# Patient Record
Sex: Male | Born: 1995
Health system: Southern US, Community
[De-identification: ages and names within clinical notes are randomized; demographics above are authoritative.]

## PROBLEM LIST (undated history)

## (undated) DIAGNOSIS — J329 Chronic sinusitis, unspecified: Secondary | ICD-10-CM

## (undated) DIAGNOSIS — N200 Calculus of kidney: Secondary | ICD-10-CM

## (undated) DIAGNOSIS — J40 Bronchitis, not specified as acute or chronic: Secondary | ICD-10-CM

## (undated) DIAGNOSIS — S83519A Sprain of anterior cruciate ligament of unspecified knee, initial encounter: Secondary | ICD-10-CM

---
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
## 2015-01-15 ENCOUNTER — Ambulatory Visit (INDEPENDENT_AMBULATORY_CARE_PROVIDER_SITE_OTHER): Payer: 59 | Admitting: Family Medicine

## 2015-01-15 VITALS — BP 146/90 | HR 77 | Wt 261.4 lb

## 2015-01-15 DIAGNOSIS — B354 Tinea corporis: Secondary | ICD-10-CM | POA: Diagnosis not present

## 2015-01-15 MED ORDER — TERBINAFINE HCL 250 MG PO TABS: 250.0000 mg | ORAL_TABLET | Freq: Every day | ORAL | Status: DC

## 2015-01-15 NOTE — Patient Instructions (Signed)
Use Lamisil AF 

## 2015-01-15 NOTE — Progress Notes (Signed)
   Subjective:    Patient ID: Joshua BartersMichael D Bohlin, male    DOB: 1996/02/20, 19 y.o.   MRN: [id number]  HPI He is here for evaluation of a skin fungal infection on the left neck area. It has been slowly growing in spite of him using a topical antifungal.   Review of Systems     Objective:   Physical Exam Linear slightly pigmented circular lesions noted on the left neck area.       Assessment & Plan:  Tinea corporis - Plan: terbinafine (LAMISIL) 250 MG tablet I will have him use one month supply of Lamisil as well as topical Lamisil. He will let me know whether this works or not.

## 2015-05-19 ENCOUNTER — Encounter: Payer: Self-pay | Admitting: Family Medicine

## 2015-10-03 IMAGING — US US ABDOMEN LIMITED
1 series · 14 of 25 positions shown · non-contrast
Comparison: None.

CLINICAL DATA: Right upper quadrant pain

EXAM:
US ABDOMEN LIMITED - RIGHT UPPER QUADRANT

[Series 1: us abdomen limited · 0.27mm/px · 14 of 49 slices shown]
[im 1/49]
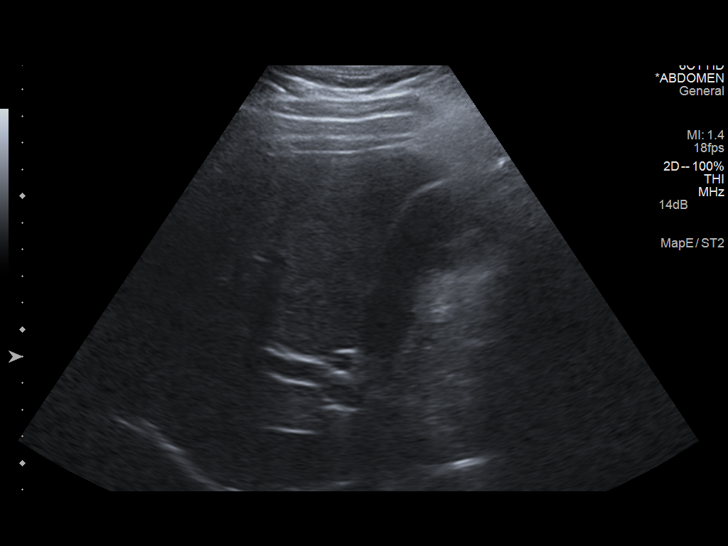
[im 5/49]
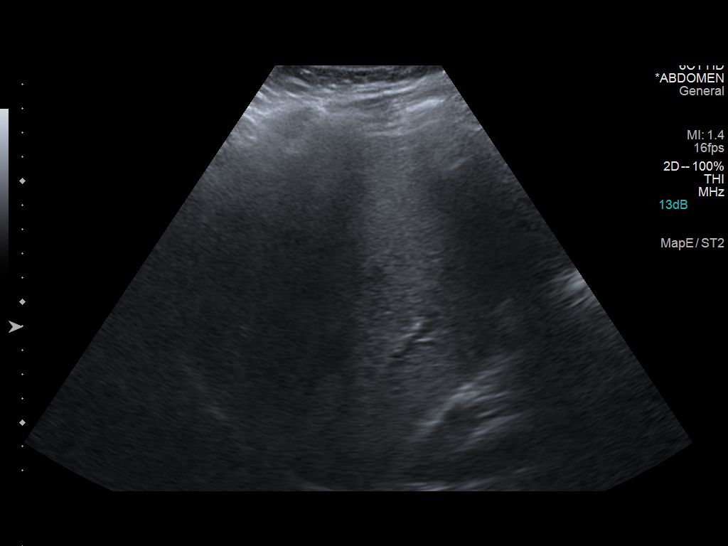
[im 9/49]
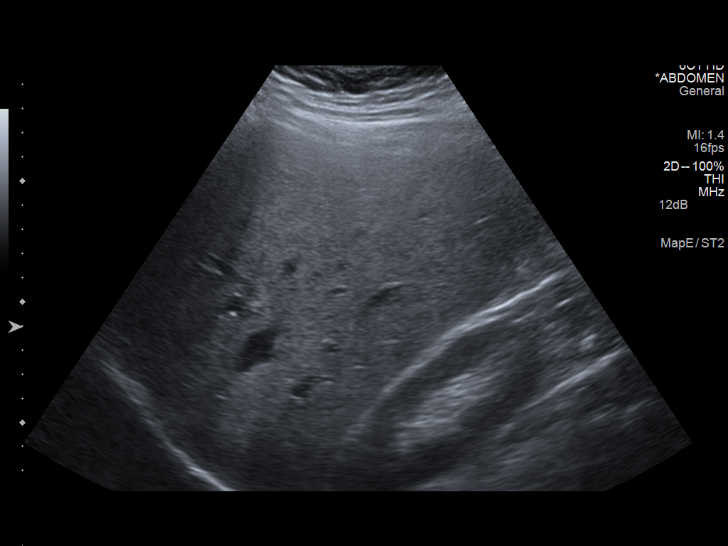
[im 13/49]
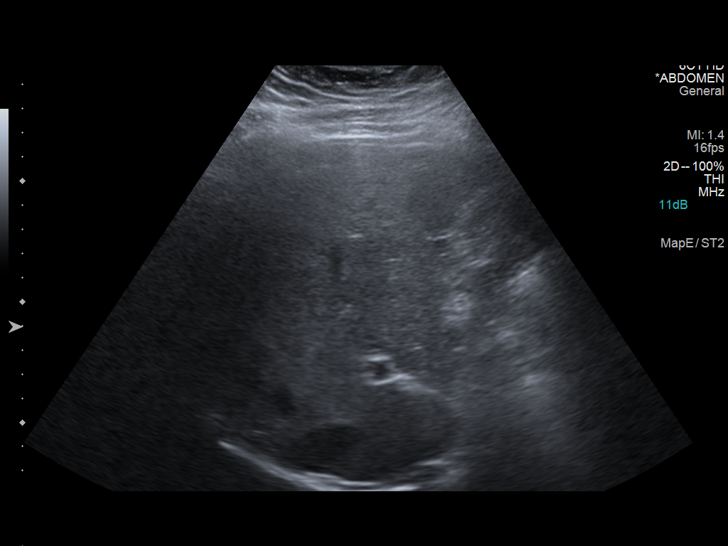
[im 17/49]
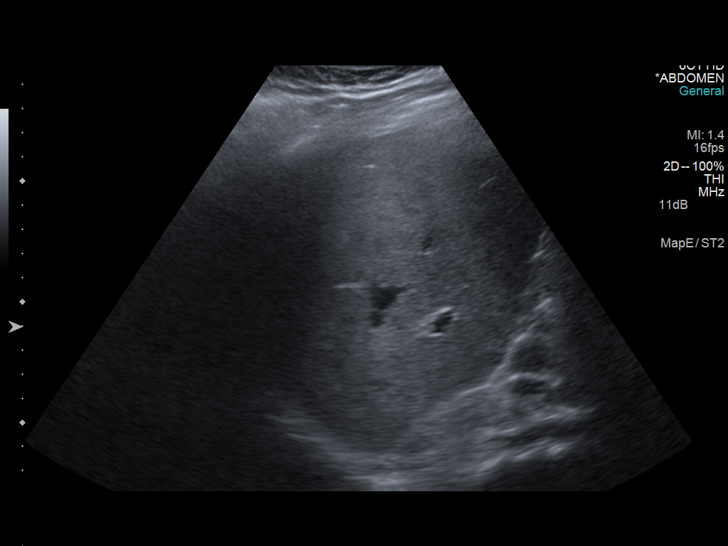
[im 19/49]
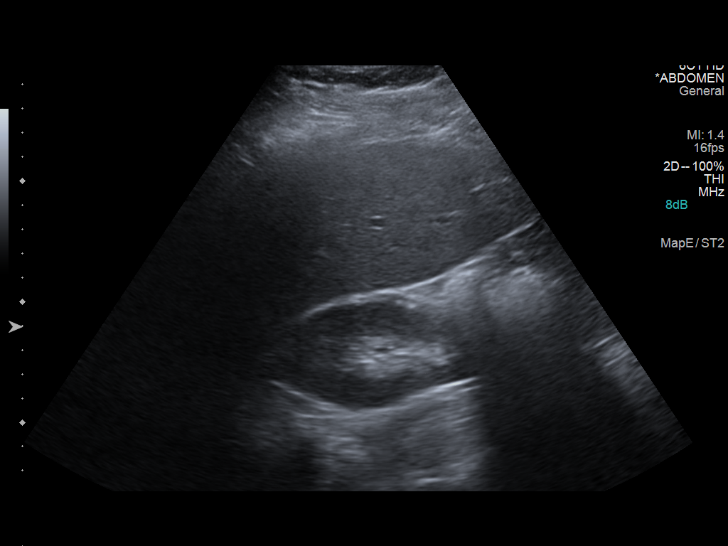
[im 23/49]
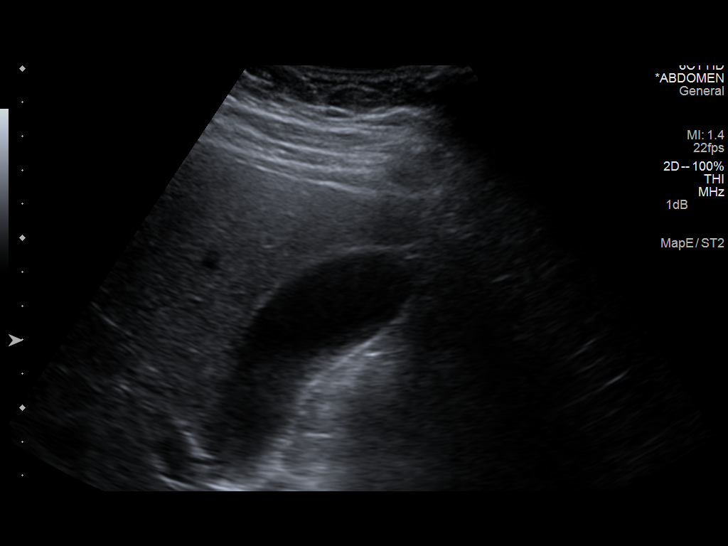
[im 27/49]
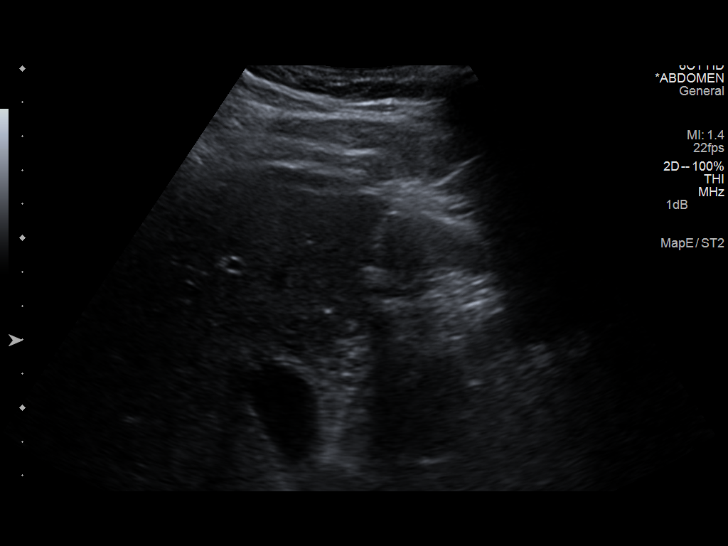
[im 31/49]
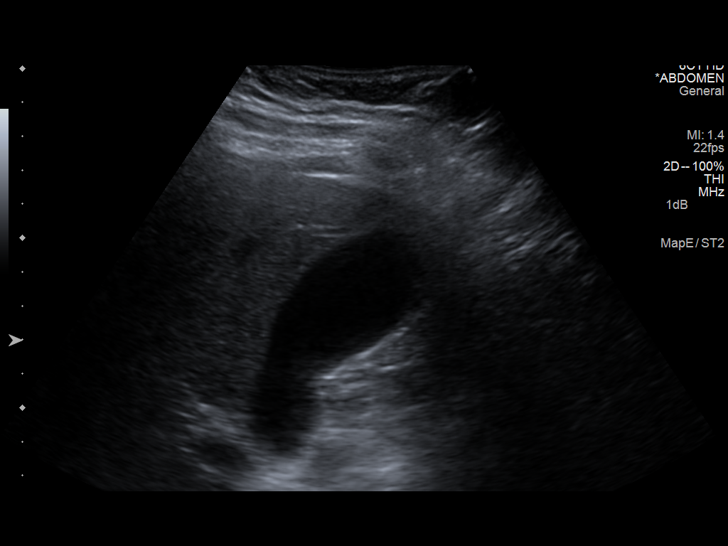
[im 33/49]
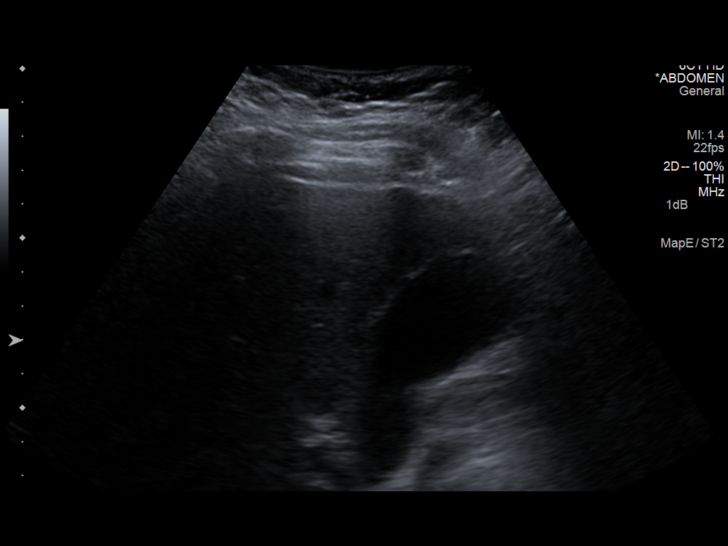
[im 37/49]
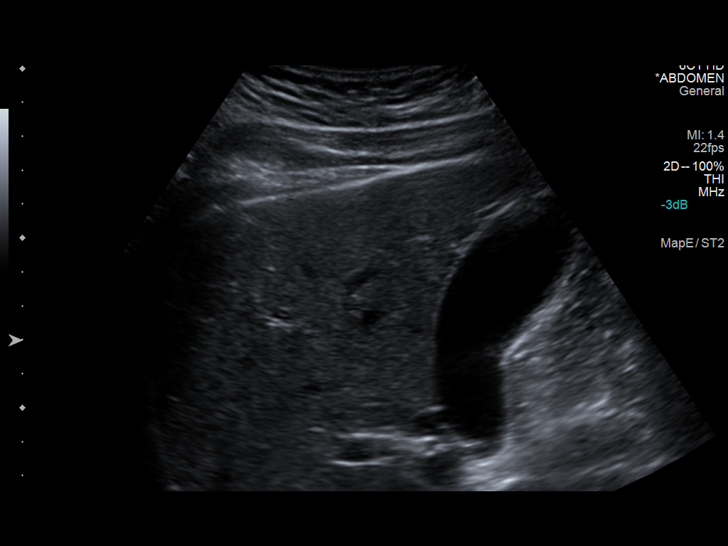
[im 41/49]
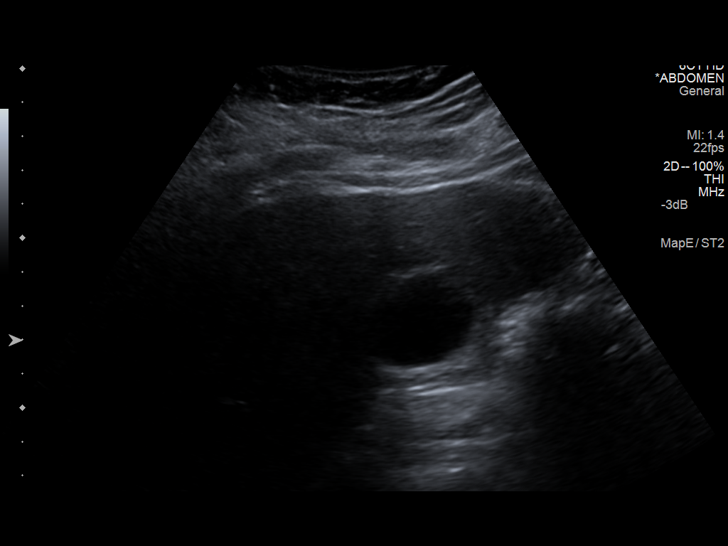
[im 45/49]
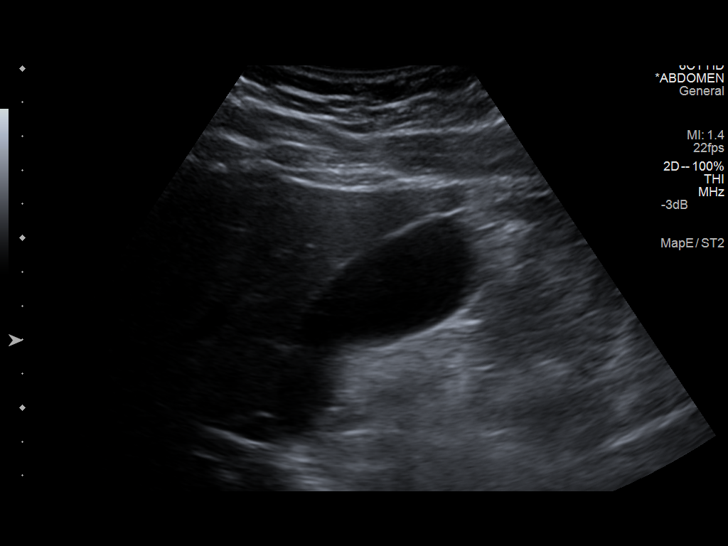
[im 49/49]
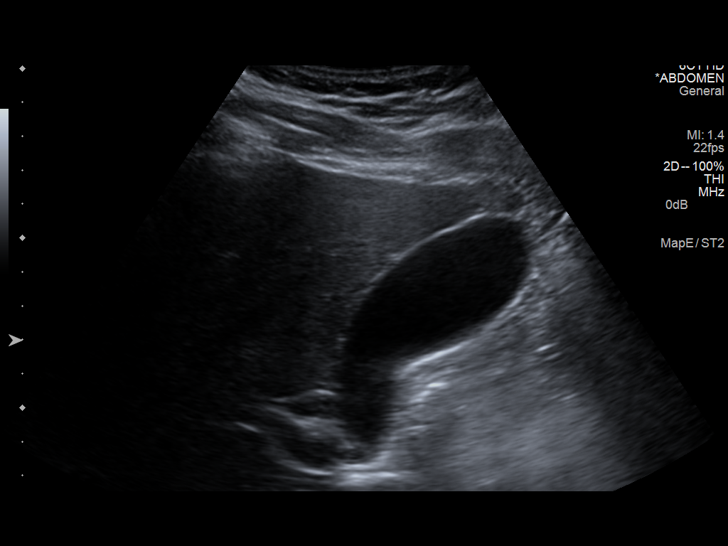

[14 of 25 positions shown; findings below may reference images not displayed]

FINDINGS: Gallbladder:

No gallstones or wall thickening visualized. No sonographic Murphy
sign noted. No pericholecystic fluid.

Common bile duct:

Diameter: 3 mm in diameter within normal limits.

Liver:

No focal lesion identified. Within normal limits in parenchymal
echogenicity.
IMPRESSION: Unremarkable right upper quadrant ultrasound.

## 2015-11-02 ENCOUNTER — Emergency Department (HOSPITAL_COMMUNITY): Payer: 59

## 2015-11-02 ENCOUNTER — Emergency Department (HOSPITAL_COMMUNITY)
Admission: EM | Admit: 2015-11-02 | Discharge: 2015-11-02 | Disposition: A | Discharge status: Home / Self Care | Payer: 59 | Attending: Emergency Medicine | Admitting: Emergency Medicine

## 2015-11-02 ENCOUNTER — Encounter (HOSPITAL_COMMUNITY): Payer: Self-pay

## 2015-11-02 DIAGNOSIS — Z87828 Personal history of other (healed) physical injury and trauma: Secondary | ICD-10-CM | POA: Diagnosis not present

## 2015-11-02 DIAGNOSIS — R1031 Right lower quadrant pain: Secondary | ICD-10-CM | POA: Insufficient documentation

## 2015-11-02 DIAGNOSIS — Z8709 Personal history of other diseases of the respiratory system: Secondary | ICD-10-CM | POA: Diagnosis not present

## 2015-11-02 DIAGNOSIS — Z87442 Personal history of urinary calculi: Secondary | ICD-10-CM | POA: Diagnosis not present

## 2015-11-02 DIAGNOSIS — R109 Unspecified abdominal pain: Secondary | ICD-10-CM | POA: Diagnosis not present

## 2015-11-02 HISTORY — DX: Calculus of kidney: N20.0

## 2015-11-02 LAB — URINALYSIS, ROUTINE W REFLEX MICROSCOPIC
BILIRUBIN URINE: NEGATIVE
Glucose, UA: NEGATIVE mg/dL
HGB URINE DIPSTICK: NEGATIVE
KETONES UR: NEGATIVE mg/dL
Leukocytes, UA: NEGATIVE
Nitrite: NEGATIVE
PROTEIN: NEGATIVE mg/dL
Specific Gravity, Urine: 1.037 — ABNORMAL HIGH (ref 1.005–1.030)
pH: 6 (ref 5.0–8.0)

## 2015-11-02 LAB — CBC WITH DIFFERENTIAL/PLATELET
Basophils Absolute: 0 10*3/uL (ref 0.0–0.1)
Basophils Relative: 0 %
EOS PCT: 1 %
Eosinophils Absolute: 0 10*3/uL (ref 0.0–0.7)
HEMATOCRIT: 45.4 % (ref 39.0–52.0)
Hemoglobin: 15.7 g/dL (ref 13.0–17.0)
LYMPHS ABS: 1.7 10*3/uL (ref 0.7–4.0)
Lymphocytes Relative: 21 %
MCH: 30 pg (ref 26.0–34.0)
MCHC: 34.6 g/dL (ref 30.0–36.0)
MCV: 86.6 fL (ref 78.0–100.0)
Monocytes Absolute: 0.6 10*3/uL (ref 0.1–1.0)
Monocytes Relative: 7 %
Neutro Abs: 5.6 10*3/uL (ref 1.7–7.7)
Neutrophils Relative %: 71 %
PLATELETS: 211 10*3/uL (ref 150–400)
RBC: 5.24 MIL/uL (ref 4.22–5.81)
RDW: 12.7 % (ref 11.5–15.5)
WBC: 7.8 10*3/uL (ref 4.0–10.5)

## 2015-11-02 LAB — BASIC METABOLIC PANEL
ANION GAP: 9 (ref 5–15)
BUN: 14 mg/dL (ref 6–20)
CO2: 26 mmol/L (ref 22–32)
Calcium: 9.5 mg/dL (ref 8.9–10.3)
Chloride: 106 mmol/L (ref 101–111)
Creatinine, Ser: 0.98 mg/dL (ref 0.61–1.24)
GFR calc Af Amer: >60 mL/min (ref >60)
GLUCOSE: 107 mg/dL — AB (ref 65–99)
POTASSIUM: 4.1 mmol/L (ref 3.5–5.1)
Sodium: 141 mmol/L (ref 135–145)

## 2015-11-02 MED ORDER — IOHEXOL 300 MG/ML  SOLN
25.0000 mL | Freq: Once | INTRAMUSCULAR | Status: AC | PRN
  Administered 2015-11-02: 25 mL via ORAL

## 2015-11-02 MED ORDER — KETOROLAC TROMETHAMINE 30 MG/ML IJ SOLN
30.0000 mg | Freq: Once | INTRAMUSCULAR | Status: AC
  Administered 2015-11-02: 30 mg via INTRAVENOUS
  Filled 2015-11-02: qty 1

## 2015-11-02 MED ORDER — MORPHINE SULFATE (PF) 4 MG/ML IV SOLN
4.0000 mg | Freq: Once | INTRAVENOUS | Status: AC
  Administered 2015-11-02: 4 mg via INTRAVENOUS
  Filled 2015-11-02: qty 1

## 2015-11-02 MED ORDER — IOHEXOL 300 MG/ML  SOLN
100.0000 mL | Freq: Once | INTRAMUSCULAR | Status: AC | PRN
  Administered 2015-11-02: 100 mL via INTRAVENOUS

## 2015-11-02 MED ORDER — SODIUM CHLORIDE 0.9 % IV BOLUS (SEPSIS)
1000.0000 mL | Freq: Once | INTRAVENOUS | Status: AC
  Administered 2015-11-02: 1000 mL via INTRAVENOUS

## 2015-11-02 MED ORDER — ONDANSETRON HCL 4 MG/2ML IJ SOLN
4.0000 mg | Freq: Once | INTRAMUSCULAR | Status: AC
  Administered 2015-11-02: 4 mg via INTRAVENOUS
  Filled 2015-11-02: qty 2

## 2015-11-02 MED ORDER — OXYCODONE-ACETAMINOPHEN 5-325 MG PO TABS: 1.0000 | ORAL_TABLET | ORAL | Status: DC | PRN

## 2015-11-02 MED ORDER — ONDANSETRON HCL 4 MG PO TABS: 4.0000 mg | ORAL_TABLET | Freq: Four times a day (QID) | ORAL | Status: DC

## 2015-11-02 NOTE — ED Notes (Signed)
Patient c/o right flank pain. Patient has a history of kidney stones. Patient states he has not urinated since 1700 yesterday. Patient states he has not felt like urinating.

## 2015-11-02 NOTE — Discharge Instructions (Signed)
CT scan showed no acute findings. Prescription for pain and nausea medicine. Follow-up your primary care doctor

## 2015-11-02 NOTE — ED Provider Notes (Signed)
CSN: [id number]     Arrival date & time 11/02/15  4098 History   First MD Initiated Contact with Patient 11/02/15 1030     Chief Complaint  Patient presents with  . Flank Pain     (Consider location/radiation/quality/duration/timing/severity/associated sxs/prior Treatment) HPI.Marland KitchenMarland KitchenMarland KitchenRight lower quadrant pain with radiation to right flank since yesterday at 5 PM. Patient had a kidney stone in June 2016 and thinks the pain is similar. Patient also stated he has had an intussusception in the past and is concerned about this as well. No fever, sweats, chills, dysuria, hematuria, vomiting, diarrhea. Severity is moderate.  Past Medical History  Diagnosis Date  . Bronchitis 07/12/2011  . Sinus infection 07/12/2011  . Jaundice of newborn   . ACL tear     left  . Kidney stones    Past Surgical History  Procedure Laterality Date  . Knee arthroscopy w/ acl reconstruction  05/27/2011    right and left  . Anterior cruciate ligament repair  08/03/2011    D[facility]ves  . Anterior cruciate ligament repair  05/25/2012    Procedure: RECONSTRUCTION ANTERIOR CRUCIATE LIGAMENT (ACL) WITH HAMSTRING GRAFT;  Surgeon: Verlee Rossetti, MD;  Location: [facility] OR;  Service: Orthopedics;  Laterality: Left;  with meniscis repair   Family History  Problem Relation Age of Onset  . Hypertension Mother   . Anesthesia problems Mother     post-op nausea  . Mental illness Mother   . Depression Mother   . Diabetes Maternal Grandmother   . Cirrhosis Maternal Grandmother   . Breast cancer Maternal Grandmother   . Stroke Paternal Grandfather   . Diabetes Maternal Aunt   . Diabetes Maternal Grandfather   . Heart disease Maternal Grandfather   . COPD Maternal Grandfather   . Depression Maternal Grandfather   . Hearing loss Maternal Grandfather   . Depression Father   . Tongue cancer Maternal Grandmother   . Colon cancer Maternal Grandmother     great  . Colon polyps Maternal Grandmother   . Irritable bowel syndrome  Mother   . Kidney disease Maternal Grandfather   . Kidney disease Maternal Aunt    Social History  Substance Use Topics  . Smoking status: Passive Smoke Exposure - Never Smoker  . Smokeless tobacco: Never Used     Comment: smokers smoke outside  . Alcohol Use: No    Review of Systems  All other systems reviewed and are negative.     Allergies  Review of patient's allergies indicates no known allergies.  Home Medications   Prior to Admission medications   Medication Sig Start Date End Date Taking? Authorizing Provider  omeprazole (PRILOSEC) 40 MG capsule Take 1 capsule (40 mg total) by mouth daily. Patient not taking: Reported on 01/15/2015 02/04/14   Beverley Fiedler, MD  ondansetron (ZOFRAN) 4 MG tablet Take 1 tablet (4 mg total) by mouth every 6 (six) hours. 11/02/15   Donnetta Hutching, MD  oxyCODONE-acetaminophen (PERCOCET) 5-325 MG tablet Take 1-2 tablets by mouth every 4 (four) hours as needed. 11/02/15   Donnetta Hutching, MD  terbinafine (LAMISIL) 250 MG tablet Take 1 tablet (250 mg total) by mouth daily. Patient not taking: Reported on 11/02/2015 01/15/15   Ronnald Nian, MD   BP 137/65 mmHg  Pulse 68  Temp(Src) 98.1 F (36.7 C) (Oral)  Resp 14  Ht  (1.778 m)  Wt 270 lb (122.471 kg)  BMI 38.74 kg/m2  SpO2 100% Physical Exam  [id number]: He is oriented to  person, place, and time. He appears well-developed and well-nourished.  HENT:  Head: Normocephalic and atraumatic.  Eyes: Conjunctivae and EOM are normal. Pupils are equal, round, and reactive to light.  Neck: Normal range of motion. Neck supple.  Cardiovascular: Normal rate and regular rhythm.   Pulmonary/Chest: Effort normal and breath sounds normal.  Abdominal: Soft. Bowel sounds are normal.  Minimal right lower quadrant tenderness  Genitourinary:  Minimal right flank tenderness  Musculoskeletal: Normal range of motion.  Neurological: He is alert and oriented to person, place, and time.  Skin: Skin is warm and  dry.  Psychiatric: He has a normal mood and affect. His behavior is normal.  Nursing note and vitals reviewed.   ED Course  Procedures (including critical care time) Labs Review Labs Reviewed  URINALYSIS, ROUTINE W REFLEX MICROSCOPIC (NOT AT [facility]) - Abnormal; Notable for the following:    Specific Gravity, Urine 1.037 (*)    All other components within normal limits  BASIC METABOLIC PANEL - Abnormal; Notable for the following:    Glucose, Bld 107 (*)    All other components within normal limits  CBC WITH DIFFERENTIAL/PLATELET    Imaging Review Ct Abdomen Pelvis W Contrast  11/02/2015  CLINICAL DATA:  Right flank pain and dysuria EXAM: CT ABDOMEN AND PELVIS WITH CONTRAST TECHNIQUE: Multidetector CT imaging of the abdomen and pelvis was performed using the standard protocol following bolus administration of intravenous contrast. Oral contrast was also administered. CONTRAST:  OMNIPAQUE IOHEXOL 300 MG/ML  SOLN COMPARISON:  June 12, 2014 FINDINGS: Lower chest:  Lung bases are clear. Hepatobiliary: Liver is prominent, measuring 21.0 cm in length. No focal liver lesions are identified. Gallbladder wall is not appreciably thickened. There is no biliary duct dilatation. Pancreas: There is no pancreatic mass or inflammatory focus. Spleen: No splenic lesions are identified. Adrenals/Urinary Tract: Adrenals appear normal bilaterally. Kidneys bilaterally show no appreciable mass or hydronephrosis on either side. There is no renal or ureteral calculus on either side. The urinary bladder is largely decompressed. The urinary bladder wall thickness is within normal limits. Stomach/Bowel: There is no appreciable bowel wall or mesenteric thickening. No bowel obstruction. No free air or portal venous air. There are scattered diverticula in the descending colon without diverticulitis. Vascular/Lymphatic: There is no abdominal aortic aneurysm. No vascular lesions are evident. There is no demonstrable  adenopathy in the abdomen or pelvis. Reproductive: Prostate and seminal vesicles appear within normal limits. There is no pelvic mass or pelvic fluid collection. Other: The appendix appears normal in size and contour. No periappendiceal region inflammation. No abscess or ascites is appreciable in the abdomen or pelvis. Musculoskeletal: There are no blastic or lytic bone lesions. No intramuscular or abdominal wall lesion. IMPRESSION: A cause 1 for patient's symptoms has not been established with this study. No renal or ureteral calculus.  No hydronephrosis. No bowel wall thickening or bowel obstruction. No abscess. Appendix appears normal. There are scattered left-sided diverticula without diverticulitis. Prominent liver without focal lesion. Electronically Signed   By: Bretta Bang III M.D.   On: 11/02/2015 12:20   I have personally reviewed and evaluated these images and lab results as part of my medical decision-making.   EKG Interpretation None      MDM   Final diagnoses:  Right flank pain    Labs, urinalysis, CT abdomen pelvis all reassuring for negative pathology. No acute abdomen at discharge. Discharge medication Percocet and Zofran 8 mg.    Donnetta Hutching, MD 11/02/15 [phone]

## 2016-07-26 ENCOUNTER — Telehealth: Payer: Self-pay | Admitting: Internal Medicine

## 2016-07-26 ENCOUNTER — Ambulatory Visit (INDEPENDENT_AMBULATORY_CARE_PROVIDER_SITE_OTHER): Payer: 59 | Admitting: Family Medicine

## 2016-07-26 ENCOUNTER — Encounter: Payer: Self-pay | Admitting: Family Medicine

## 2016-07-26 ENCOUNTER — Ambulatory Visit
Admission: RE | Admit: 2016-07-26 | Discharge: 2016-07-26 | Disposition: A | Discharge status: Home / Self Care | Payer: 59 | Source: Ambulatory Visit | Attending: Family Medicine | Admitting: Family Medicine

## 2016-07-26 VITALS — BP 120/90 | HR 78 | Temp 97.8°F | Ht 70.0 in | Wt 270.0 lb

## 2016-07-26 DIAGNOSIS — R05 Cough: Secondary | ICD-10-CM | POA: Diagnosis not present

## 2016-07-26 DIAGNOSIS — R6889 Other general symptoms and signs: Secondary | ICD-10-CM

## 2016-07-26 DIAGNOSIS — M25512 Pain in left shoulder: Secondary | ICD-10-CM

## 2016-07-26 DIAGNOSIS — R059 Cough, unspecified: Secondary | ICD-10-CM

## 2016-07-26 LAB — POC INFLUENZA A&B (BINAX/QUICKVUE)
INFLUENZA B, POC: NEGATIVE
Influenza A, POC: NEGATIVE

## 2016-07-26 LAB — POCT RAPID STREP A (OFFICE): Rapid Strep A Screen: NEGATIVE

## 2016-07-26 MED ORDER — TRIAMCINOLONE ACETONIDE 40 MG/ML IJ SUSP
40.0000 mg | Freq: Once | INTRAMUSCULAR | Status: AC
  Administered 2016-07-26: 40 mg via INTRAMUSCULAR

## 2016-07-26 MED ORDER — LIDOCAINE HCL 2 % IJ SOLN
3.0000 mL | Freq: Once | INTRAMUSCULAR | Status: AC
  Administered 2016-07-26: 60 mg

## 2016-07-26 MED ORDER — AMOXICILLIN-POT CLAVULANATE 875-125 MG PO TABS: 1.0000 | ORAL_TABLET | Freq: Two times a day (BID) | ORAL | 0 refills | Status: DC

## 2016-07-26 MED FILL — AMOX-CLAV 875-125 MG TABLET: 875-125 | 7 days supply | Qty: 14 | Fill #0

## 2016-07-26 NOTE — Progress Notes (Signed)
Subjective:  Joshua Cook is a 20 y.o. male who presents for an acute onset of a 2 day history of fever (did not check at home), chills, body aches, rhinorrhea, nasal congestion, sore throat, post nasal drainage, dry cough and some mild nausea.  Does not smoke.  Childhood history of asthma. Reports being a preemie with lung problems as a baby.  Denies ear pain, chest pain, palpitations, shortness of breath, wheezing, vomiting or diarrhea.   Treatment to date: dayquil.  Denies sick contacts.  No other aggravating or relieving factors.   Also complains of a 1 week history of left shoulder pain, mainly anterior and radiates down his bicep. He first noted pain upon waking up 5 days ago. Denies injury or history of left shoulder pain.  Reports shoulder is tender, Pain is worse with laying on that shoulder and with movement and relieved with rest. No numbness or weakness but questionable tingling to left bicep.   ROS as in subjective.   Objective: Vitals:   07/26/16 0808  BP: 120/90  Pulse: 78  Temp: 97.8 F (36.6 C)    General appearance: Alert, WD/WN, no distress, mildly ill appearing                             Skin: warm, no rash                           Head: no sinus tenderness                            Eyes: conjunctiva normal, corneas clear, PERRLA                            Ears: pearly TMs, external ear canals normal                          Nose: septum midline, turbinates swollen, with erythema and thick purulent discharge             Mouth/throat: MMM, tongue normal, moderate pharyngeal erythema                           Neck: supple, no adenopathy, no thyromegaly, nontender                          Heart: RRR, normal S1, S2, no murmurs                         Lungs: CTA bilaterally, no wheezes, rales, or rhonchi   Extremities: left shoulder without erythema, edema, warmth, obvious deformity. Tenderness to anterior   and lateral AC joint. Pain with movement. Unable to do  a full exam due to pain and limited ROM.        Assessment: Flu-like symptoms - Plan: POC Influenza A&B(BINAX/QUICKVUE), POCT rapid strep A  Cough  Acute pain of left shoulder - Plan: DG Shoulder Left, lidocaine (XYLOCAINE) 2 % (with pres) injection 60 mg, triamcinolone acetonide (KENALOG-40) injection 40 mg    Plan:  Flu swab negative. Rapid strep negative. Discussed diagnosis and treatment of URI.  His upper respiratory symptoms appear to be related to viral etiology Suggested symptomatic OTC remedies. Nasal saline  spray for congestion.  Tylenol or Ibuprofen OTC for fever and malaise. Samples of Norel AD given. Advised that I will send an antibiotic prescription to his pharmacy and that if he is not improving in the next 2-3 days or if he worsens then he will start the antibiotic. Prescription sent now due to holiday weekend.  X-ray ordered for left shoulder. If his x-ray is normal then he will return to the office later today for an injection by Dr. Susann GivensLalonde. XR of shoulder negative. Patient returned and requested a steroid injection to his left shoulder.  Dr. Susann GivensLalonde cleaned the area with betadine. Lidocaine and triamcinolone was injected into the subacromial bursa. Patient tolerated procedure well.  Plan to have him follow up as needed.

## 2016-07-26 NOTE — Patient Instructions (Addendum)
Go for the XR to look at your left shoulder and then come back to the office.   For your upper respiratory symptoms, it appears to be related to a virus at this point. Treat your symptoms. You can take the Norel AD 1 tablet every 4-6 hours. Stay well hydrated. Try Delsym, Robitussin-DM, Mucinex DM if the Norel AD is not working.

## 2016-07-26 NOTE — Telephone Encounter (Signed)
If he hasn't made any improvement does get him in to see Terrilee FilesZach Cook or Fermin SchwabMike Rigby

## 2016-07-26 NOTE — Telephone Encounter (Signed)
Pt's shoulder is no better after shot.

## 2016-08-01 NOTE — Telephone Encounter (Signed)
Patient has been feeling better will wait to see how long shot will last

## 2016-09-23 ENCOUNTER — Ambulatory Visit: Payer: 59 | Admitting: Family Medicine

## 2016-09-24 DIAGNOSIS — J189 Pneumonia, unspecified organism: Secondary | ICD-10-CM | POA: Diagnosis not present

## 2016-09-24 DIAGNOSIS — J111 Influenza due to unidentified influenza virus with other respiratory manifestations: Secondary | ICD-10-CM | POA: Diagnosis not present

## 2016-09-29 ENCOUNTER — Ambulatory Visit (INDEPENDENT_AMBULATORY_CARE_PROVIDER_SITE_OTHER): Payer: 59 | Admitting: Family Medicine

## 2016-09-29 ENCOUNTER — Telehealth: Payer: Self-pay | Admitting: Internal Medicine

## 2016-09-29 ENCOUNTER — Encounter: Payer: Self-pay | Admitting: Family Medicine

## 2016-09-29 VITALS — BP 130/90 | HR 92 | Temp 98.2°F | Ht 70.5 in | Wt 274.8 lb

## 2016-09-29 DIAGNOSIS — R220 Localized swelling, mass and lump, head: Secondary | ICD-10-CM

## 2016-09-29 DIAGNOSIS — K111 Hypertrophy of salivary gland: Secondary | ICD-10-CM

## 2016-09-29 DIAGNOSIS — J392 Other diseases of pharynx: Secondary | ICD-10-CM

## 2016-09-29 LAB — POCT MONO (EPSTEIN BARR VIRUS): Mono, POC: NEGATIVE

## 2016-09-29 NOTE — Telephone Encounter (Signed)
Let's have him give this more time. As long as he is not getting any worse. Have him call mid morning tomorrow and let me know how he is doing.

## 2016-09-29 NOTE — Telephone Encounter (Signed)
Pt was notified.  

## 2016-09-29 NOTE — Patient Instructions (Signed)
Use lemon drops. Keep me posted if you notice any worsening symptoms.   Mucocele of the Mouth Introduction A mucocele is a growth or bump (cyst) that is filled with mucus. A mucocele can form on various parts of the mouth, including the gums, the tongue, and the inside of the cheeks. A common spot is inside the lower lip. Mucoceles are not dangerous, and they are usually not painful. A mucocele can be uncomfortable if it is very large or if it is located under the tongue. Small mucoceles often clear up on their own. Treatment may not be needed. Mucoceles that are bigger or that keep coming back might need to be removed. What are the causes? Mucoceles form when the salivary ducts in the mouth are damaged and leak saliva. These ducts carry saliva from the salivary glands to the surface of the mouth. A mucocele can develop because of:  An injury to your mouth.  Sucking or biting on your lips or tongue.  A blocked salivary duct. This is sometimes caused by swelling.  Piercing of the tongue or lip for jewelry. In some cases, the cause may not be known. What are the signs or symptoms? Symptoms of this condition include a smooth, painless bumpinside your mouth. The bump may:  Show up all of a sudden.  Have thin walls and a bluish color.  Change in size. Most are smaller than  inch. Mucoceles under the tongue are called ranulas. These may be bigger and may push your tongue up and back. In some cases, this can make it hard to talk, swallow, or breathe. How is this diagnosed? This condition is usually diagnosed with a physical exam. Your health care provider will often be able to tell if you have a mucocele by looking at it and feeling it. You may also have tests, such as:  An ultrasound to check for problems with your salivary gland.  An X-ray to see if stones are blocking the exit of saliva. How is this treated? Treatment may depend on the size of the mucocele:  For a small mucocele,  treatment is usually not needed. It will drain on its own and go away.  For larger mucoceles and ranulas, surgery may be needed. This may be done if the mucocele does not go away or if it keeps coming back. The entire mucocele may be taken out. In some cases, the salivary gland may also be removed. Follow these instructions at home:  Take over-the-counter and prescription medicines only as told by your health care provider.  Do not try to drain a mucocele on your own. Do not poke a hole in it.  Do not use any tobacco products, such as cigarettes, chewing tobacco, and e-cigarettes. If you need help quitting, ask your health care provider.  Do not suck or bite on your lips or tongue.  If your mucocele was removed, avoid hard or spicy foods and foods that have high acidity while your mouth is healing.  Keep all follow-up visits as told by your health care provider. This is important. Contact a health care provider if:  You have a lump or cyst inside your mouth that does not go away.  You have a fever. Get help right away if:  You have a lump or cyst in your mouth that:  Becomes painful.  Gets large very fast.  You have a lump or cyst in your mouth that makes it hard to:  Swallow.  Talk.  Breathe. This information  is not intended to replace advice given to you by your health care provider. Make sure you discuss any questions you have with your health care provider. Document Released: 09/18/2015 Document Revised: 01/28/2016 Document Reviewed: 11/17/2014  2017 [employer]

## 2016-09-29 NOTE — Telephone Encounter (Signed)
Pt states he is not better at all and no swelling as gone down at all yet

## 2016-09-29 NOTE — Progress Notes (Signed)
   Subjective:    Patient ID: Joshua Cook, male    DOB: 1996-04-19, 21 y.o.   MRN: [id number]  HPI Chief Complaint  Patient presents with  . face swelling    face swelling. lymph nodes were swelling the other day and woke up this morning face swelling. he had flu and pneumonia last week   He is here with complaints of right sided facial swelling since yesterday. States swelling and pain was mild yesterday and when he woke up this morning the area under his right jaw was severely swollen and painful to touch.    States he was dagnosed with flu and pneumonia 6 [place]. States he is taking levofloxacin 750 mg once daily. Also taking Tessalon and theraflu States flu symptoms have improved and cough is not as bad. Coughing up green and white mucus occasionally.    Denies fever, chills, body aches, dizziness, headache, rhinorrhea, nasal congestion, ear pain, shortness of breath, chest pain, abdominal pain, GI or GU symptoms.  Denies difficulty swallowing or breathing.   Reviewed allergies, medications, past medical,  and social history.   Review of Systems Pertinent positives and negatives in the history of present illness.     Objective:   Physical Exam  Constitutional: He is oriented to person, place, and time. He appears well-developed and well-nourished. He does not have a sickly appearance. No distress.  HENT:  Right Ear: Tympanic membrane and ear canal normal.  Left Ear: Tympanic membrane and ear canal normal.  Nose: Mucosal edema and rhinorrhea present. Right sinus exhibits no maxillary sinus tenderness and no frontal sinus tenderness. Left sinus exhibits no maxillary sinus tenderness and no frontal sinus tenderness.  Mouth/Throat: Uvula is midline, oropharynx is clear and moist and mucous membranes are normal. No posterior oropharyngeal edema.  Right sided parotid swelling and tenderness. No erythema.   Cardiovascular: Normal rate, regular rhythm  and normal heart sounds.   Pulmonary/Chest: Effort normal. He has wheezes in the right middle field and the right lower field.  Neurological: He is alert and oriented to person, place, and time.  Skin: Skin is warm and dry.   BP 130/90   Pulse 92   Temp 98.2 F (36.8 C) (Oral) Comment: 98.2  Ht 5' 10.5" (1.791 m)   Wt 274 lb 12.8 oz (124.6 kg)   SpO2 97%   BMI 38.87 kg/m       Assessment & Plan:  Parotid gland enlargement  Right facial swelling - Plan: POCT Mono (Epstein Barr Virus)  Throat irritation  Dr. 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 He will continue taking antibiotics for recent diagnosis of pneumonia. Flu and pneumonia symptoms have improved.  Discussed that he appears to be improving from recent illnesses and the reason he came in today does not appear to be related.  He will try lemon drops and call me later today to give me an update.

## 2017-12-11 ENCOUNTER — Telehealth: Payer: Self-pay | Admitting: Internal Medicine

## 2017-12-11 NOTE — Telephone Encounter (Signed)
Tried to call pt but number listed is not correct. Pt is due for cpe

## 2020-05-04 DIAGNOSIS — D485 Neoplasm of uncertain behavior of skin: Secondary | ICD-10-CM | POA: Diagnosis not present

## 2020-05-04 DIAGNOSIS — D2239 Melanocytic nevi of other parts of face: Secondary | ICD-10-CM | POA: Diagnosis not present

## 2020-05-04 DIAGNOSIS — D2262 Melanocytic nevi of left upper limb, including shoulder: Secondary | ICD-10-CM | POA: Diagnosis not present

## 2020-07-23 DIAGNOSIS — M25461 Effusion, right knee: Secondary | ICD-10-CM | POA: Diagnosis not present

## 2020-07-23 DIAGNOSIS — M25561 Pain in right knee: Secondary | ICD-10-CM | POA: Diagnosis not present

## 2020-07-24 DIAGNOSIS — M7651 Patellar tendinitis, right knee: Secondary | ICD-10-CM | POA: Diagnosis not present

## 2020-08-11 DIAGNOSIS — R0981 Nasal congestion: Secondary | ICD-10-CM | POA: Diagnosis not present

## 2020-08-11 DIAGNOSIS — Z20828 Contact with and (suspected) exposure to other viral communicable diseases: Secondary | ICD-10-CM | POA: Diagnosis not present

## 2020-08-11 DIAGNOSIS — R051 Acute cough: Secondary | ICD-10-CM | POA: Diagnosis not present

## 2021-03-13 DIAGNOSIS — Z20828 Contact with and (suspected) exposure to other viral communicable diseases: Secondary | ICD-10-CM | POA: Diagnosis not present

## 2021-03-15 DIAGNOSIS — R051 Acute cough: Secondary | ICD-10-CM | POA: Diagnosis not present

## 2021-03-15 DIAGNOSIS — Z20828 Contact with and (suspected) exposure to other viral communicable diseases: Secondary | ICD-10-CM | POA: Diagnosis not present

## 2021-03-15 DIAGNOSIS — R519 Headache, unspecified: Secondary | ICD-10-CM | POA: Diagnosis not present

## 2021-05-15 ENCOUNTER — Encounter (HOSPITAL_BASED_OUTPATIENT_CLINIC_OR_DEPARTMENT_OTHER): Payer: Self-pay | Admitting: Emergency Medicine

## 2021-05-15 ENCOUNTER — Emergency Department (HOSPITAL_BASED_OUTPATIENT_CLINIC_OR_DEPARTMENT_OTHER): Payer: BC Managed Care – PPO

## 2021-05-15 ENCOUNTER — Emergency Department (HOSPITAL_BASED_OUTPATIENT_CLINIC_OR_DEPARTMENT_OTHER)
Admission: EM | Admit: 2021-05-15 | Discharge: 2021-05-15 | Disposition: A | Discharge status: Home / Self Care | Payer: BC Managed Care – PPO | Attending: Emergency Medicine | Admitting: Emergency Medicine

## 2021-05-15 ENCOUNTER — Other Ambulatory Visit: Payer: Self-pay

## 2021-05-15 DIAGNOSIS — Q8909 Congenital malformations of spleen: Secondary | ICD-10-CM | POA: Diagnosis not present

## 2021-05-15 DIAGNOSIS — R109 Unspecified abdominal pain: Secondary | ICD-10-CM | POA: Diagnosis not present

## 2021-05-15 DIAGNOSIS — N200 Calculus of kidney: Secondary | ICD-10-CM | POA: Insufficient documentation

## 2021-05-15 DIAGNOSIS — Z7722 Contact with and (suspected) exposure to environmental tobacco smoke (acute) (chronic): Secondary | ICD-10-CM | POA: Diagnosis not present

## 2021-05-15 DIAGNOSIS — M8448XA Pathological fracture, other site, initial encounter for fracture: Secondary | ICD-10-CM | POA: Diagnosis not present

## 2021-05-15 DIAGNOSIS — N1330 Unspecified hydronephrosis: Secondary | ICD-10-CM | POA: Diagnosis not present

## 2021-05-15 DIAGNOSIS — N059 Unspecified nephritic syndrome with unspecified morphologic changes: Secondary | ICD-10-CM | POA: Diagnosis not present

## 2021-05-15 DIAGNOSIS — I10 Essential (primary) hypertension: Secondary | ICD-10-CM | POA: Insufficient documentation

## 2021-05-15 LAB — HEPATIC FUNCTION PANEL
ALT: 47 U/L — ABNORMAL HIGH (ref 0–44)
AST: 28 U/L (ref 15–41)
Albumin: 4.7 g/dL (ref 3.5–5.0)
Alkaline Phosphatase: 56 U/L (ref 38–126)
Bilirubin, Direct: 0.1 mg/dL (ref 0.0–0.2)
Indirect Bilirubin: 0.6 mg/dL (ref 0.3–0.9)
Total Bilirubin: 0.7 mg/dL (ref 0.3–1.2)
Total Protein: 7.4 g/dL (ref 6.5–8.1)

## 2021-05-15 LAB — CBC WITH DIFFERENTIAL/PLATELET
Abs Immature Granulocytes: 0.02 10*3/uL (ref 0.00–0.07)
Basophils Absolute: 0 10*3/uL (ref 0.0–0.1)
Basophils Relative: 0 %
Eosinophils Absolute: 0.1 10*3/uL (ref 0.0–0.5)
Eosinophils Relative: 1 %
HCT: 43.2 % (ref 39.0–52.0)
Hemoglobin: 15.5 g/dL (ref 13.0–17.0)
Immature Granulocytes: 0 %
Lymphocytes Relative: 13 %
Lymphs Abs: 1.1 10*3/uL (ref 0.7–4.0)
MCH: 30.6 pg (ref 26.0–34.0)
MCHC: 35.9 g/dL (ref 30.0–36.0)
MCV: 85.4 fL (ref 80.0–100.0)
Monocytes Absolute: 0.4 10*3/uL (ref 0.1–1.0)
Monocytes Relative: 4 %
Neutro Abs: 7 10*3/uL (ref 1.7–7.7)
Neutrophils Relative %: 82 %
Platelets: 170 10*3/uL (ref 150–400)
RBC: 5.06 MIL/uL (ref 4.22–5.81)
RDW: 12.5 % (ref 11.5–15.5)
WBC: 8.5 10*3/uL (ref 4.0–10.5)
nRBC: 0 % (ref 0.0–0.2)

## 2021-05-15 LAB — BASIC METABOLIC PANEL
Anion gap: 7 (ref 5–15)
BUN: 11 mg/dL (ref 6–20)
CO2: 27 mmol/L (ref 22–32)
Calcium: 9.2 mg/dL (ref 8.9–10.3)
Chloride: 104 mmol/L (ref 98–111)
Creatinine, Ser: 1.09 mg/dL (ref 0.61–1.24)
GFR, Estimated: >60 mL/min (ref >60)
Glucose, Bld: 122 mg/dL — ABNORMAL HIGH (ref 70–99)
Potassium: 4 mmol/L (ref 3.5–5.1)
Sodium: 138 mmol/L (ref 135–145)

## 2021-05-15 LAB — LIPASE, BLOOD: Lipase: 24 U/L (ref 11–51)

## 2021-05-15 MED ORDER — KETOROLAC TROMETHAMINE 15 MG/ML IJ SOLN
15.0000 mg | Freq: Once | INTRAMUSCULAR | Status: AC
  Administered 2021-05-15: 15 mg via INTRAVENOUS
  Filled 2021-05-15: qty 1

## 2021-05-15 MED ORDER — SODIUM CHLORIDE 0.9 % IV BOLUS
1000.0000 mL | Freq: Once | INTRAVENOUS | Status: AC
Start: 2021-05-15 — End: 2021-05-15
  Administered 2021-05-15: 1000 mL via INTRAVENOUS

## 2021-05-15 MED ORDER — ONDANSETRON HCL 4 MG PO TABS: 4.0000 mg | ORAL_TABLET | Freq: Four times a day (QID) | ORAL | 0 refills | Status: DC

## 2021-05-15 MED ORDER — MORPHINE SULFATE (PF) 4 MG/ML IV SOLN
4.0000 mg | Freq: Once | INTRAVENOUS | Status: AC
  Administered 2021-05-15: 4 mg via INTRAVENOUS
  Filled 2021-05-15: qty 1

## 2021-05-15 MED ORDER — ONDANSETRON HCL 4 MG/2ML IJ SOLN
4.0000 mg | Freq: Once | INTRAMUSCULAR | Status: AC
  Administered 2021-05-15: 4 mg via INTRAVENOUS
  Filled 2021-05-15: qty 2

## 2021-05-15 MED ORDER — OXYCODONE HCL 5 MG PO TABS: 5.0000 mg | ORAL_TABLET | Freq: Four times a day (QID) | ORAL | 0 refills | Status: DC | PRN

## 2021-05-15 MED ORDER — TAMSULOSIN HCL 0.4 MG PO CAPS: 0.4000 mg | ORAL_CAPSULE | Freq: Every day | ORAL | 0 refills | Status: AC

## 2021-05-15 NOTE — Discharge Instructions (Addendum)
Recommend 1000 mg of Tylenol every 6 hours as needed for pain, recommend 800 mg ibuprofen every 8 hours as needed for pain.  Use oxycodone for breakthrough pain.  This is a narcotic pain medicine.  Do not mix with alcohol or other drugs.  Do not drive while taking this medication or doing other dangerous activities.  Take Zofran as needed for nausea and vomiting.  Please return if develop worsening pain or fever.  Follow-up with urology.

## 2021-05-15 NOTE — ED Triage Notes (Signed)
Right flank pain radiating to lower abdomen, Hx kidney stones. Urinary frequency

## 2021-05-15 NOTE — ED Provider Notes (Signed)
[facility] EMERGENCY DEPARTMENT Provider Note   CSN: [id number] Arrival date & time: 05/15/21  1036     History Chief Complaint  Patient presents with   Flank Pain    right    Joshua Cook is a 25 y.o. male.  History of kidney stones, right-sided abdominal pain since this morning.  Right lower, right flank.  Nausea but no vomiting.  The history is provided by the patient.  Flank Pain This is a new problem. The current episode started 6 to 12 hours ago. The problem occurs constantly. The problem has not changed since onset.Associated symptoms include abdominal pain. Pertinent negatives include no chest pain, no headaches and no shortness of breath. Nothing aggravates the symptoms. Nothing relieves the symptoms. He has tried nothing for the symptoms. The treatment provided no relief.      Past Medical History:  Diagnosis Date   ACL tear    left   Bronchitis 07/12/2011   Jaundice of newborn    Kidney stones    Sinus infection 07/12/2011    Patient Active Problem List   Diagnosis Date Noted   Sprain of cruciate ligament of knee 05/25/2012   Acne 03/05/2012   S/P repair of anterior cruciate ligament 03/05/2012    Past Surgical History:  Procedure Laterality Date   ANTERIOR CRUCIATE LIGAMENT REPAIR  08/03/2011   DFr.Graves   ANTERIOR CRUCIATE LIGAMENT REPAIR  05/25/2012   Procedure: RECONSTRUCTION ANTERIOR CRUCIATE LIGAMENT (ACL) WITH HAMSTRING GRAFT;  Surgeon: Verlee RossettiSteven R Norris, MD;  Location: [facility] OR;  Service: Orthopedics;  Laterality: Left;  with meniscis repair   KNEE ARTHROSCOPY W/ ACL RECONSTRUCTION  05/27/2011   right and left       Family History  Problem Relation Age of Onset   Hypertension Mother    Anesthesia problems Mother        post-op nausea   Mental illness Mother    Depression Mother    Diabetes Maternal Grandmother    Cirrhosis Maternal Grandmother    Breast cancer Maternal Grandmother    Stroke Paternal Grandfather    Diabetes  Maternal Aunt    Diabetes Maternal Grandfather    Heart disease Maternal Grandfather    COPD Maternal Grandfather    Depression Maternal Grandfather    Hearing loss Maternal Grandfather    Depression Father    Tongue cancer Maternal Grandmother    Colon cancer Maternal Grandmother        great   Colon polyps Maternal Grandmother    Irritable bowel syndrome Mother    Kidney disease Maternal Grandfather    Kidney disease Maternal Aunt     Social History   Tobacco Use   Smoking status: Passive Smoke Exposure - Never Smoker   Smokeless tobacco: Never   Tobacco comments:    smokers smoke outside  Substance Use Topics   Alcohol use: No   Drug use: No    Home Medications Prior to Admission medications   Medication Sig Start Date End Date Taking? Authorizing Provider  ondansetron (ZOFRAN) 4 MG tablet Take 1 tablet (4 mg total) by mouth every 6 (six) hours. 05/15/21  Yes Carmesha Morocco, DO  oxyCODONE (ROXICODONE) 5 MG immediate release tablet Take 1 tablet (5 mg total) by mouth every 6 (six) hours as needed for up to 15 doses for severe pain. 05/15/21  Yes Sheliah Fiorillo, DO  tamsulosin (FLOMAX) 0.4 MG CAPS capsule Take 1 capsule (0.4 mg total) by mouth daily for 7 days. 05/15/21 05/22/21  Yes Antonia Jicha, DO  benzonatate (TESSALON) 200 MG capsule  09/24/16   [provider]  levofloxacin (LEVAQUIN) 750 MG tablet  09/24/16   [provider]    Allergies    Patient has no known allergies.  Review of Systems   Review of Systems  Constitutional:  Negative for chills and fever.  HENT:  Negative for ear pain and sore throat.   Eyes:  Negative for pain and visual disturbance.  Respiratory:  Negative for cough and shortness of breath.   Cardiovascular:  Negative for chest pain and palpitations.  Gastrointestinal:  Positive for abdominal pain. Negative for vomiting.  Genitourinary:  Positive for flank pain. Negative for dysuria and hematuria.  Musculoskeletal:  Negative  for arthralgias and back pain.  Skin:  Negative for color change and rash.  Neurological:  Negative for seizures, syncope and headaches.  All other systems reviewed and are negative.  Physical Exam Updated Vital Signs BP (!) 163/95 (BP Location: Right Arm)   Pulse 63   Temp 98.4 F (36.9 C) (Oral)   Resp 18   Ht 5\' 9"  (1.753 m)   Wt 117 kg   SpO2 100%   BMI 38.10 kg/m   Physical Exam Vitals and nursing note reviewed.  Constitutional:      Appearance: He is well-developed.  HENT:     Head: Normocephalic and atraumatic.     Nose: Nose normal.     Mouth/Throat:     Mouth: Mucous membranes are moist.  Eyes:     Extraocular Movements: Extraocular movements intact.     Conjunctiva/sclera: Conjunctivae normal.     Pupils: Pupils are equal, round, and reactive to light.  Cardiovascular:     Rate and Rhythm: Normal rate and regular rhythm.     Pulses: Normal pulses.     Heart sounds: Normal heart sounds. No murmur heard. Pulmonary:     Effort: Pulmonary effort is normal. No respiratory distress.     Breath sounds: Normal breath sounds.  Abdominal:     Palpations: Abdomen is soft.     Tenderness: There is abdominal tenderness (right flank). There is right CVA tenderness.  Musculoskeletal:        General: Normal range of motion.     Cervical back: Normal range of motion and neck supple.  Skin:    General: Skin is warm and dry.     Capillary Refill: Capillary refill takes less than 2 seconds.  Neurological:     General: No focal deficit present.     Mental Status: He is alert.    ED Results / Procedures / Treatments   Labs (all labs ordered are listed, but only abnormal results are displayed) Labs Reviewed  BASIC METABOLIC PANEL - Abnormal; Notable for the following components:      Result Value   Glucose, Bld 122 (*)    All other components within normal limits  HEPATIC FUNCTION PANEL - Abnormal; Notable for the following components:   ALT 47 (*)    All other  components within normal limits  CBC WITH DIFFERENTIAL/PLATELET  LIPASE, BLOOD    EKG None  Radiology CT Renal Stone Study  Result Date: 05/15/2021 CLINICAL DATA:  25 year old male with right flank pain radiating to the lower abdomen with urinary frequency. EXAM: CT ABDOMEN AND PELVIS WITHOUT CONTRAST TECHNIQUE: Multidetector CT imaging of the abdomen and pelvis was performed following the standard protocol without IV contrast. COMPARISON:  CT Abdomen and Pelvis 11/02/2015. FINDINGS: Lower chest: Negative.  Hepatobiliary: Evidence of chronic hepatic steatosis. Negative noncontrast gallbladder. Pancreas: Negative. Spleen: Negative.  Splenule, normal variant. Adrenals/Urinary Tract: Normal adrenal glands. Negative noncontrast left kidney and left ureter. Moderate right hydronephrosis. Nephromegaly and mild pararenal inflammation. Right hydroureter and periureteral stranding continues into the pelvis where a 3-4 mm calculus is located less than 1 cm from the right UVJ. No other urinary calculus identified. And otherwise negative bladder. Stomach/Bowel: Negative. Normal appendix on series 2, image 61. No dilated bowel, free air, or free fluid. Vascular/Lymphatic: Normal caliber abdominal aorta. No calcified atherosclerosis or lymphadenopathy. Reproductive: Negative. Other: No pelvic free fluid. Musculoskeletal: Chronic lower thoracic endplate changes. Chronic bilateral L5 pars fractures without spondylolisthesis. No acute osseous abnormality identified. IMPRESSION: 1. Acute obstructive uropathy on the right due to a 3-4 mm calculus located less than 1 cm from the right UVJ. No other urinary calculus identified. 2. Chronic hepatic steatosis. 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 Electronically Signed   By: Odessa Fleming M.D.   On: 05/15/2021 11:20    Procedures Procedures   Medications Ordered in ED Medications  ketorolac (TORADOL) 15 MG/ML injection 15 mg (has no administration in time range)   morphine 4 MG/ML injection 4 mg (4 mg Intravenous Given 05/15/21 1059)  ondansetron (ZOFRAN) injection 4 mg (4 mg Intravenous Given 05/15/21 1058)  sodium chloride 0.9 % bolus 1,000 mL (1,000 mLs Intravenous New Bag/Given 05/15/21 1102)    ED Course  I have reviewed the triage vital signs and the nursing notes.  Pertinent labs & imaging results that were available during my care of the patient were reviewed by me and considered in my medical decision making (see chart for details).    MDM Rules/Calculators/A&P                           Veverly Fells Matheson is here with right flank pain.  History of kidney stones.  Normal vitals except for some mild hypertension.  Otherwise no fever.  Pain started last several hours.  Did do some manual labor yesterday.  Differential includes kidney stone versus less likely cholecystitis, appendicitis.  Could be muscular process.  We will get basic labs including CT scan abdomen pelvis.  Will give IV fluids, IV nausea medicine, IV pain medicine.  Patient with right-sided kidney stone about 3 to 4 mm.  However no significant leukocytosis, anemia, electrolyte abnormality, kidney injury otherwise.  No fever.  Will prescribe pain medicine and nausea medicine.  Recommend follow-up with primary care doctor.  Given return precautions.  This chart was dictated using voice recognition software.  Despite best efforts to proofread,  errors can occur which can change the documentation meaning.   This chart was dictated using voice recognition software.  Despite best efforts to proofread,  errors can occur which can change the documentation meaning.   Final Clinical Impression(s) / ED Diagnoses Final diagnoses:  Kidney stone    Rx / DC Orders ED Discharge Orders          Ordered    tamsulosin (FLOMAX) 0.4 MG CAPS capsule  Daily        05/15/21 1140    oxyCODONE (ROXICODONE) 5 MG immediate release tablet  Every 6 hours PRN        05/15/21 1140    ondansetron (ZOFRAN)  4 MG tablet  Every 6 hours        05/15/21 1140  Virgina Norfolk, DO 05/15/21 1143

## 2021-05-19 DIAGNOSIS — N3001 Acute cystitis with hematuria: Secondary | ICD-10-CM | POA: Diagnosis not present

## 2021-05-19 DIAGNOSIS — E669 Obesity, unspecified: Secondary | ICD-10-CM | POA: Diagnosis not present

## 2021-05-19 DIAGNOSIS — Z6838 Body mass index (BMI) 38.0-38.9, adult: Secondary | ICD-10-CM | POA: Diagnosis not present

## 2021-05-19 DIAGNOSIS — N200 Calculus of kidney: Secondary | ICD-10-CM | POA: Diagnosis not present

## 2021-06-12 DIAGNOSIS — L255 Unspecified contact dermatitis due to plants, except food: Secondary | ICD-10-CM | POA: Diagnosis not present

## 2021-11-01 DIAGNOSIS — M25462 Effusion, left knee: Secondary | ICD-10-CM | POA: Diagnosis not present

## 2021-11-01 DIAGNOSIS — Z6839 Body mass index (BMI) 39.0-39.9, adult: Secondary | ICD-10-CM | POA: Diagnosis not present

## 2021-11-04 DIAGNOSIS — M25562 Pain in left knee: Secondary | ICD-10-CM | POA: Diagnosis not present

## 2021-11-22 DIAGNOSIS — M791 Myalgia, unspecified site: Secondary | ICD-10-CM | POA: Diagnosis not present

## 2021-11-22 DIAGNOSIS — R509 Fever, unspecified: Secondary | ICD-10-CM | POA: Diagnosis not present

## 2022-02-11 ENCOUNTER — Other Ambulatory Visit: Payer: Self-pay

## 2022-02-11 ENCOUNTER — Encounter (HOSPITAL_BASED_OUTPATIENT_CLINIC_OR_DEPARTMENT_OTHER): Payer: Self-pay

## 2022-02-11 DIAGNOSIS — J0100 Acute maxillary sinusitis, unspecified: Secondary | ICD-10-CM | POA: Diagnosis not present

## 2022-02-11 DIAGNOSIS — Z7722 Contact with and (suspected) exposure to environmental tobacco smoke (acute) (chronic): Secondary | ICD-10-CM | POA: Insufficient documentation

## 2022-02-11 DIAGNOSIS — G44019 Episodic cluster headache, not intractable: Secondary | ICD-10-CM | POA: Insufficient documentation

## 2022-02-11 DIAGNOSIS — R519 Headache, unspecified: Secondary | ICD-10-CM | POA: Diagnosis not present

## 2022-02-11 NOTE — ED Triage Notes (Signed)
Pt reports right sided headache since Friday that radiates to jaw. Had an episode where he bent over and an orange tinted fluid came out of nose.

## 2022-02-12 ENCOUNTER — Emergency Department (HOSPITAL_BASED_OUTPATIENT_CLINIC_OR_DEPARTMENT_OTHER)
Admission: EM | Admit: 2022-02-12 | Discharge: 2022-02-12 | Disposition: A | Discharge status: Home / Self Care | Payer: BC Managed Care – PPO | Attending: Emergency Medicine | Admitting: Emergency Medicine

## 2022-02-12 ENCOUNTER — Emergency Department (HOSPITAL_BASED_OUTPATIENT_CLINIC_OR_DEPARTMENT_OTHER): Payer: BC Managed Care – PPO

## 2022-02-12 DIAGNOSIS — G44019 Episodic cluster headache, not intractable: Secondary | ICD-10-CM

## 2022-02-12 DIAGNOSIS — J0100 Acute maxillary sinusitis, unspecified: Secondary | ICD-10-CM

## 2022-02-12 DIAGNOSIS — R519 Headache, unspecified: Secondary | ICD-10-CM | POA: Diagnosis not present

## 2022-02-12 MED ORDER — AMOXICILLIN-POT CLAVULANATE 875-125 MG PO TABS: 1.0000 | ORAL_TABLET | Freq: Two times a day (BID) | ORAL | 0 refills | Status: AC

## 2022-02-12 MED ORDER — AMOXICILLIN-POT CLAVULANATE 875-125 MG PO TABS
1.0000 | ORAL_TABLET | Freq: Once | ORAL | Status: AC
  Administered 2022-02-12: 1 via ORAL
  Filled 2022-02-12: qty 1

## 2022-02-12 MED ORDER — ZOLMITRIPTAN 5 MG NA SOLN: NASAL | 0 refills | Status: AC

## 2022-02-12 NOTE — ED Provider Notes (Signed)
[facility] EMERGENC[facility] Provider Note: Joshua Dell, MD, FACEP  CSN: [id number] MRN: [id number] ARRIVAL: 02/11/22 at 2144 ROOM: DB014/DB014   CHIEF COMPLAINT  Headache   HISTORY OF PRESENT ILLNESS  02/12/22 1:56 AM Joshua Cook is a 26 y.o. male with about a 1 week history of headaches.  The headaches occur on the right side of his head or face.  They occur randomly without triggers.  They typically come on suddenly and sharply.  They do not involve his right eye nor does he have tearing of the right eye with these headaches.  They typically last about 30 minutes.  Exertion often makes them worse.  Yesterday he had several episodes of an orange liquid come out of his right nostril while leaning forward.  He is not having sinus pain.  He denies normal nasal mucus drainage.   Past Medical History:  Diagnosis Date   ACL tear    left   Bronchitis 07/12/2011   Jaundice of newborn    Kidney stones    Sinus infection 07/12/2011    Past Surgical History:  Procedure Laterality Date   ANTERIOR CRUCIATE LIGAMENT REPAIR  08/03/2011   [facility]Fr.Graves   ANTERIOR CRUCIATE LIGAMENT REPAIR  05/25/2012   Procedure: RECONSTRUCTION ANTERIOR CRUCIATE LIGAMENT (ACL) WITH HAMSTRING GRAFT;  Surgeon: Verlee Rossetti, MD;  Location: [facility] OR;  Service: Orthopedics;  Laterality: Left;  with meniscis repair   KNEE ARTHROSCOPY W/ ACL RECONSTRUCTION  05/27/2011   right and left    Family History  Problem Relation Age of Onset   Hypertension Mother    Anesthesia problems Mother        post-op nausea   Mental illness Mother    Depression Mother    Diabetes Maternal Grandmother    Cirrhosis Maternal Grandmother    Breast cancer Maternal Grandmother    Stroke Paternal Grandfather    Diabetes Maternal Aunt    Diabetes Maternal Grandfather    Heart disease Maternal Grandfather    COPD Maternal Grandfather    Depression Maternal Grandfather    Hearing loss Maternal Grandfather    Depression Father    Tongue  cancer Maternal Grandmother    Colon cancer Maternal Grandmother        great   Colon polyps Maternal Grandmother    Irritable bowel syndrome Mother    Kidney disease Maternal Grandfather    Kidney disease Maternal Aunt     Social History   Tobacco Use   Smoking status: Passive Smoke Exposure - Never Smoker   Smokeless tobacco: Never   Tobacco comments:    smokers smoke outside  Substance Use Topics   Alcohol use: No   Drug use: No    Prior to Admission medications   Medication Sig Start Date End Date Taking? Authorizing Provider  amoxicillin-clavulanate (AUGMENTIN) 875-125 MG tablet Take 1 tablet by mouth every 12 (twelve) hours. 02/12/22  Yes Shreya Lacasse, MD  zolmitriptan (ZOMIG) 5 MG nasal solution 1 spray in 1 nostril as needed for cluster headache.  May repeat in 2 hours if symptoms persist.  Maximum 2 sprays/day. 02/12/22  Yes Lessa Huge, MD    Allergies Patient has no known allergies.   REVIEW OF SYSTEMS  Negative except as noted here or in the History of Present Illness.   PHYSICAL EXAMINATION  Initial Vital Signs Blood pressure 117/69, pulse 86, temperature 98.1 F (36.7 C), resp. rate 18, height 5\' 9"  (1.753 m), weight 117 kg, SpO2 100 %.  Examination General:  Well-developed, well-nourished male in no acute distress; appearance consistent with age of record HENT: normocephalic; atraumatic; right scalp and face nontender Eyes: pupils equal, round and reactive to light; extraocular muscles intact Neck: supple Heart: regular rate and rhythm Lungs: clear to auscultation bilaterally Abdomen: soft; nondistended; nontender; bowel sounds present Extremities: No deformity; full range of motion; pulses normal Neurologic: Awake, alert and oriented; motor function intact in all extremities and symmetric; no facial droop Skin: Warm and dry Psychiatric: Normal mood and affect   RESULTS  Summary of this visit's results, reviewed and interpreted by myself:   EKG  Interpretation  Date/Time:    Ventricular Rate:    PR Interval:    QRS Duration:   QT Interval:    QTC Calculation:   R Axis:     Text Interpretation:         Laboratory Studies: No results found for this or any previous visit (from the past 24 hour(s)). Imaging Studies: CT Head Wo Contrast  Result Date: 02/12/2022 CLINICAL DATA:  Headache, sudden, severe EXAM: CT HEAD WITHOUT CONTRAST TECHNIQUE: Contiguous axial images were obtained from the base of the skull through the vertex without intravenous contrast. RADIATION DOSE REDUCTION: This exam was performed according to the departmental dose-optimization program which includes automated exposure control, adjustment of the mA and/or kV according to patient size and/or use of iterative reconstruction technique. COMPARISON:  None Available. FINDINGS: Brain: No acute intracranial abnormality. Specifically, no hemorrhage, hydrocephalus, mass lesion, acute infarction, or significant intracranial injury. Vascular: No hyperdense vessel or unexpected calcification. Skull: No acute calvarial abnormality. Sinuses/Orbits: Mucosal thickening and possible air-fluid level in the right maxillary sinus. Remainder the paranasal sinuses are clear. Other: None IMPRESSION: No acute intracranial abnormality. Right maxillary sinusitis. Electronically Signed   By: Charlett Nose M.D.   On: 02/12/2022 02:29    ED COURSE and MDM  Nursing notes, initial and subsequent vitals signs, including pulse oximetry, reviewed and interpreted by myself.  Vitals:   02/11/22 2200 02/11/22 2200 02/12/22 0008 02/12/22 0100  BP:  (!) 152/89 136/88 117/69  Pulse:  94 84 86  Resp:  18 18 18   Temp:  98.1 F (36.7 C)    SpO2:  98% 98% 100%  Weight: 117 kg     Height: 5\' 9"  (1.753 m)      Medications  amoxicillin-clavulanate (AUGMENTIN) 875-125 MG per tablet 1 tablet (has no administration in time range)    The patient's abnormal nasal drainage correlates with the right  maxillary sinusitis seen on CT scan.  We will treat him with a course of Augmentin.  The headaches are consistent with cluster headaches.  He is in the right age group and they affect the trigeminal nerve distribution with appropriate frequency and persistence.  We will treat him with intranasal zolmitriptan.  PROCEDURES  Procedures   ED DIAGNOSES     ICD-10-CM   1. Episodic cluster headache, not intractable  G44.019     2. Acute non-recurrent maxillary sinusitis  J01.00          Michelina Mexicano, , MD 02/12/22 [phone]

## 2022-03-14 DIAGNOSIS — L237 Allergic contact dermatitis due to plants, except food: Secondary | ICD-10-CM | POA: Diagnosis not present

## 2022-03-14 DIAGNOSIS — L299 Pruritus, unspecified: Secondary | ICD-10-CM | POA: Diagnosis not present

## 2022-04-18 DIAGNOSIS — Z6838 Body mass index (BMI) 38.0-38.9, adult: Secondary | ICD-10-CM | POA: Diagnosis not present

## 2022-04-18 DIAGNOSIS — J029 Acute pharyngitis, unspecified: Secondary | ICD-10-CM | POA: Diagnosis not present

## 2022-04-18 DIAGNOSIS — I10 Essential (primary) hypertension: Secondary | ICD-10-CM | POA: Diagnosis not present

## 2022-04-18 DIAGNOSIS — H60502 Unspecified acute noninfective otitis externa, left ear: Secondary | ICD-10-CM | POA: Diagnosis not present

## 2022-05-11 ENCOUNTER — Encounter: Payer: Self-pay | Admitting: Internal Medicine

## 2022-06-14 ENCOUNTER — Encounter: Payer: Self-pay | Admitting: Internal Medicine

## 2022-06-27 ENCOUNTER — Encounter: Payer: Self-pay | Admitting: Internal Medicine

## 2022-07-17 DIAGNOSIS — R519 Headache, unspecified: Secondary | ICD-10-CM | POA: Diagnosis not present

## 2022-12-29 DIAGNOSIS — Z3009 Encounter for other general counseling and advice on contraception: Secondary | ICD-10-CM | POA: Diagnosis not present

## 2023-01-03 DIAGNOSIS — Z302 Encounter for sterilization: Secondary | ICD-10-CM | POA: Diagnosis not present

## 2023-05-12 DIAGNOSIS — R051 Acute cough: Secondary | ICD-10-CM | POA: Diagnosis not present

## 2023-05-12 DIAGNOSIS — R062 Wheezing: Secondary | ICD-10-CM | POA: Diagnosis not present

## 2023-05-12 DIAGNOSIS — R030 Elevated blood-pressure reading, without diagnosis of hypertension: Secondary | ICD-10-CM | POA: Diagnosis not present

## 2023-05-14 DIAGNOSIS — H66001 Acute suppurative otitis media without spontaneous rupture of ear drum, right ear: Secondary | ICD-10-CM | POA: Diagnosis not present

## 2023-07-30 DIAGNOSIS — M799 Soft tissue disorder, unspecified: Secondary | ICD-10-CM | POA: Diagnosis not present

## 2023-07-30 DIAGNOSIS — Z4789 Encounter for other orthopedic aftercare: Secondary | ICD-10-CM | POA: Diagnosis not present

## 2023-07-30 DIAGNOSIS — M25462 Effusion, left knee: Secondary | ICD-10-CM | POA: Diagnosis not present

## 2023-09-18 DIAGNOSIS — Z6838 Body mass index (BMI) 38.0-38.9, adult: Secondary | ICD-10-CM | POA: Diagnosis not present

## 2023-09-18 DIAGNOSIS — Z636 Dependent relative needing care at home: Secondary | ICD-10-CM | POA: Diagnosis not present

## 2023-10-25 IMAGING — CT CT HEAD W/O CM
4 series · 16 of 47 positions shown, 18 images · non-contrast
Comparison: None Available.

CLINICAL DATA: Headache, sudden, severe



[Series 2: head wo · axial · 0.42mm/px · z∈[+954,+1074]mm · 7 of 33 slices shown, 9 images]
[im 5/33  brain]
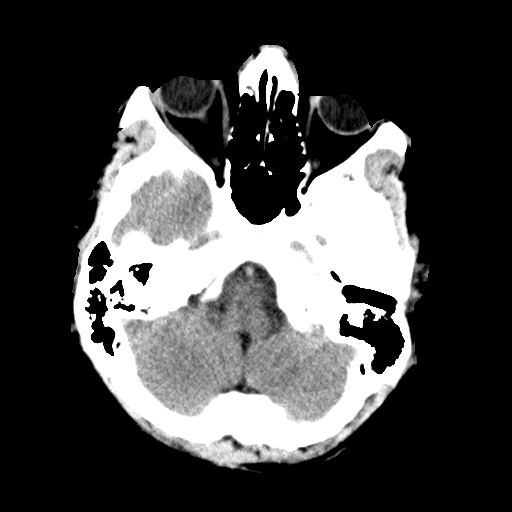
[im 5/33  bone]
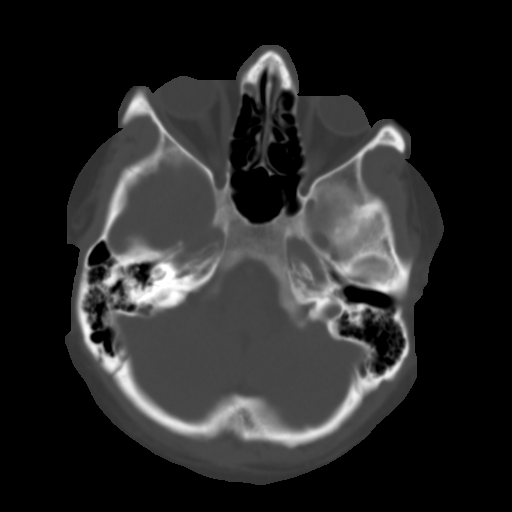
[im 9/33  brain]
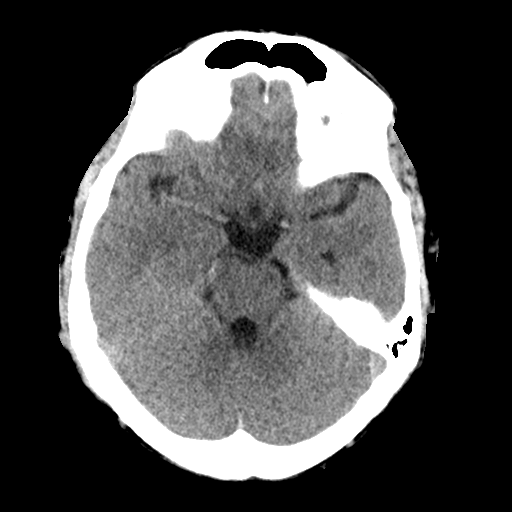
[im 13/33  brain]
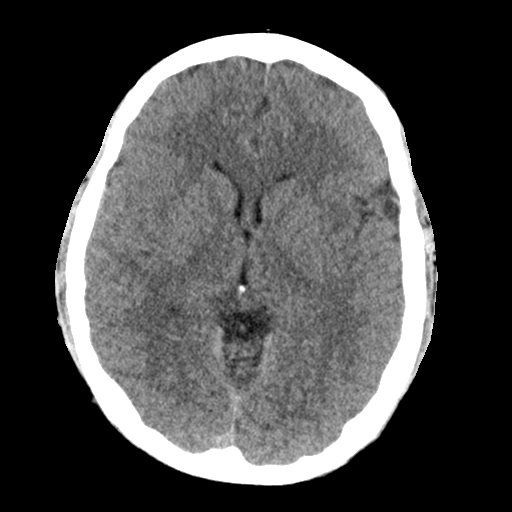
[im 17/33  brain]
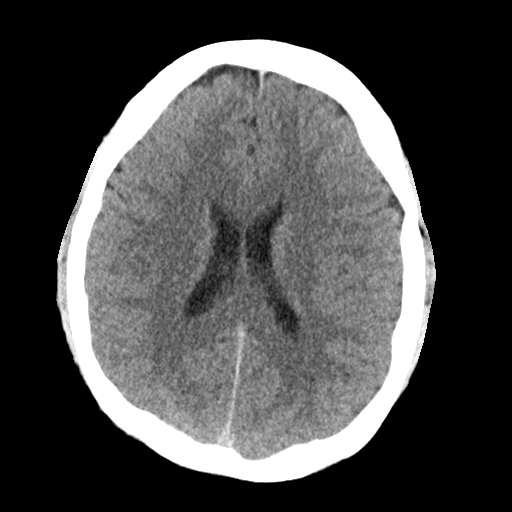
[im 21/33  brain]
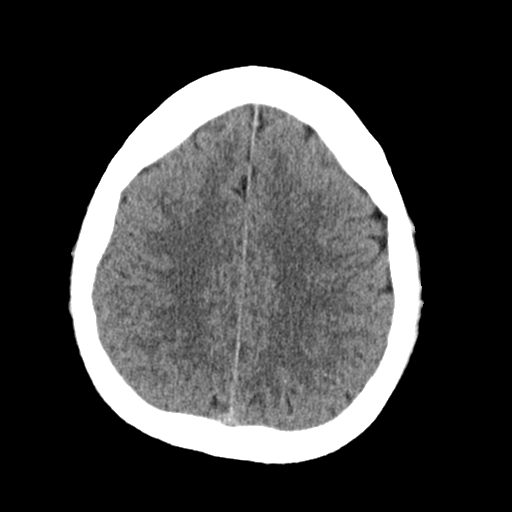
[im 21/33  bone]
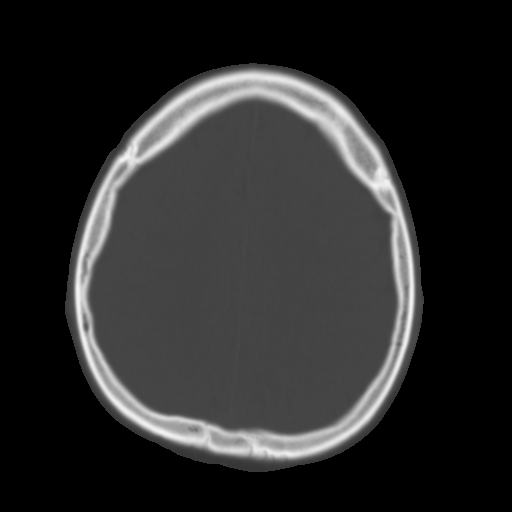
[im 25/33  brain]
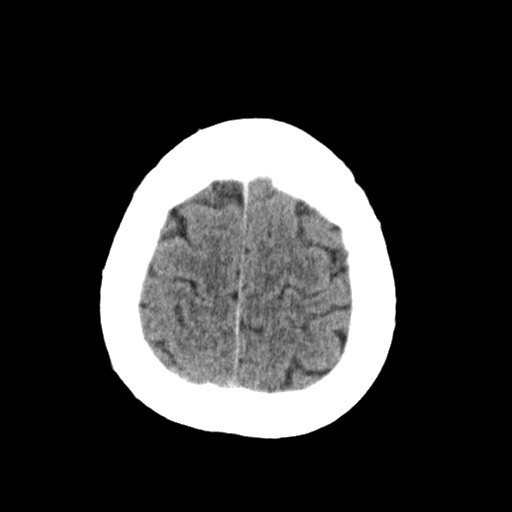
[im 29/33  brain]
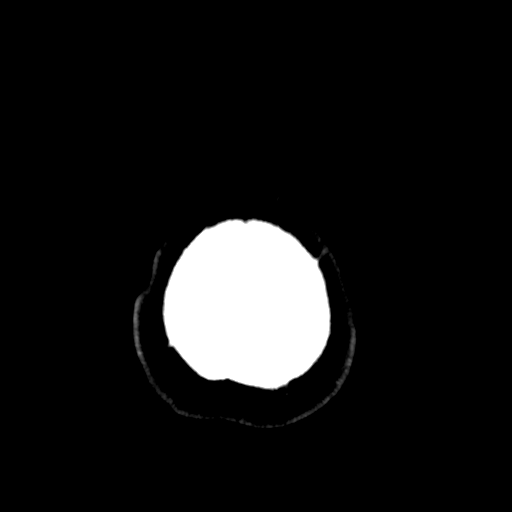

[Series 3: head bone · axial · 0.42mm/px · z∈[+950,+982]mm · 3 of 81 slices shown]
[im 9/81  bone]
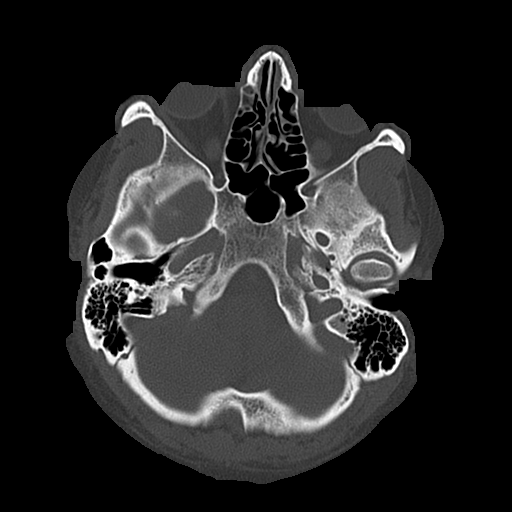
[im 17/81  bone]
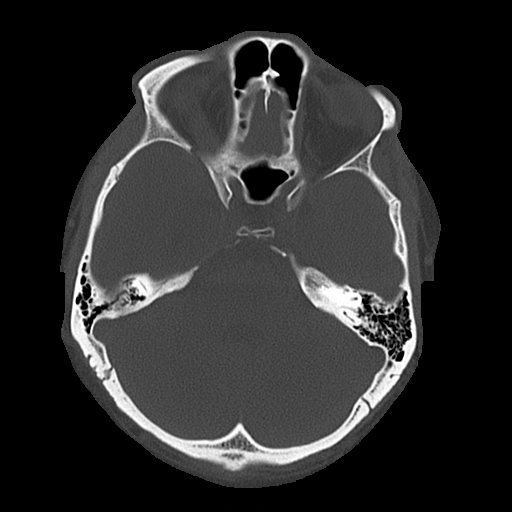
[im 25/81  bone]
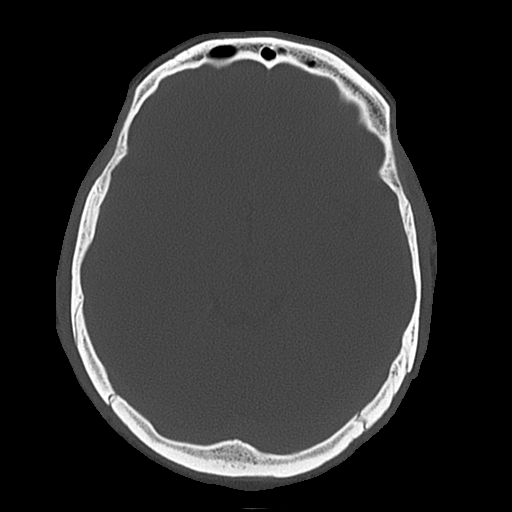

[Series 4: coronal soft · coronal · 0.33mm/px · 3 of 70 slices shown]
[im 24/70  brain]
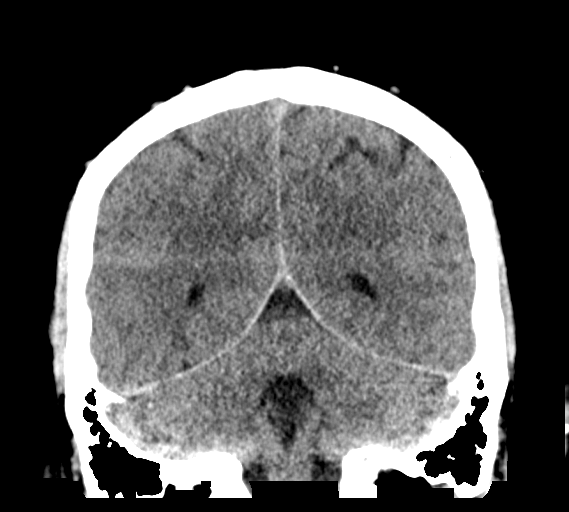
[im 31/70  brain]
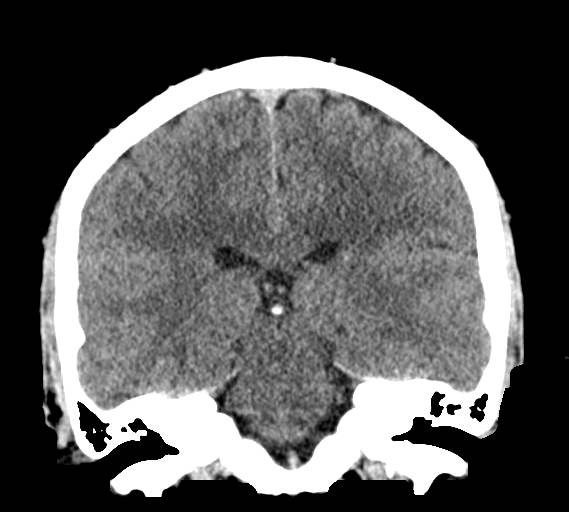
[im 39/70  brain]
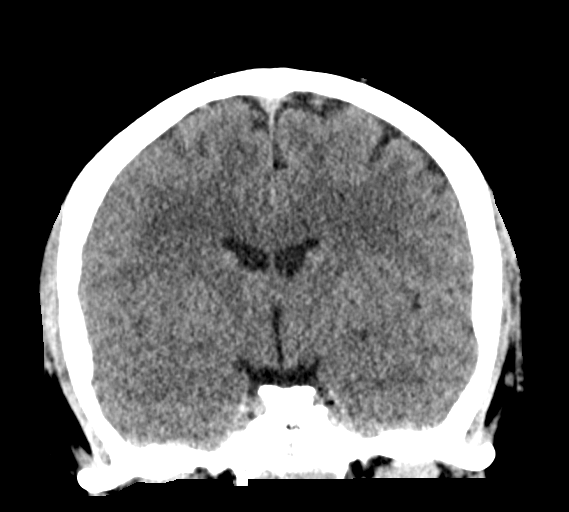

[Series 5: sagittal soft · sagittal · 0.33mm/px · 3 of 64 slices shown]
[im 22/64  brain]
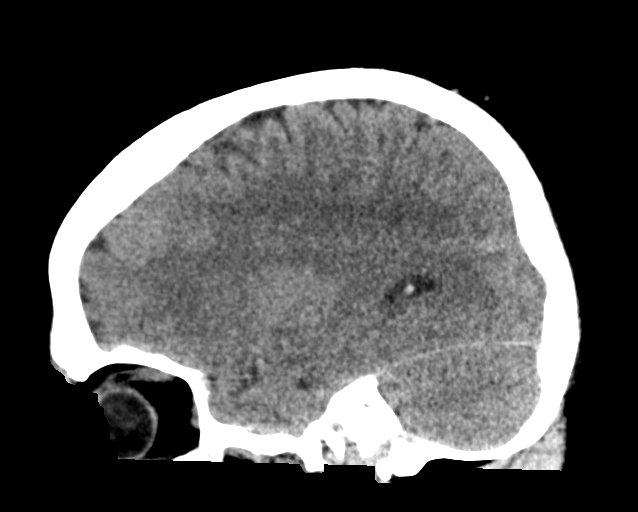
[im 32/64  brain]
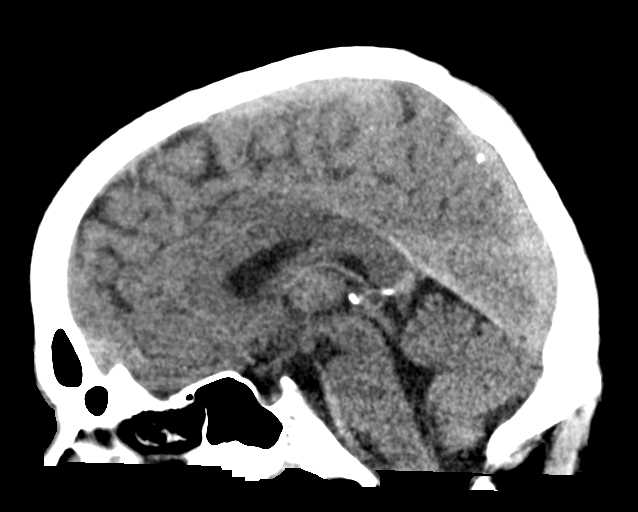
[im 43/64  brain]
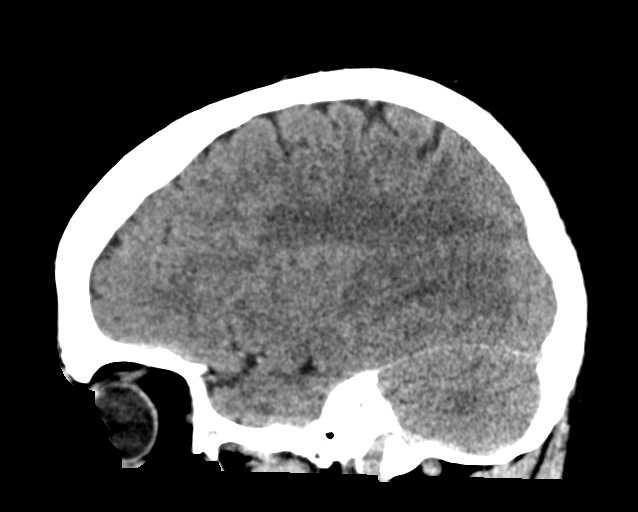

[16 of 47 positions shown; findings below may reference images not displayed]

FINDINGS: Brain: No acute intracranial abnormality. Specifically, no
hemorrhage, hydrocephalus, mass lesion, acute infarction, or
significant intracranial injury.

Vascular: No hyperdense vessel or unexpected calcification.

Skull: No acute calvarial abnormality.

Sinuses/Orbits: Mucosal thickening and possible air-fluid level in
the right maxillary sinus. Remainder the paranasal sinuses are
clear.

Other: None
IMPRESSION: No acute intracranial abnormality.

Right maxillary sinusitis.

## 2023-10-31 ENCOUNTER — Encounter: Payer: Self-pay | Admitting: Internal Medicine

## 2024-05-27 DIAGNOSIS — M25461 Effusion, right knee: Secondary | ICD-10-CM | POA: Diagnosis not present

## 2024-05-27 DIAGNOSIS — M25562 Pain in left knee: Secondary | ICD-10-CM | POA: Diagnosis not present

## 2024-05-27 DIAGNOSIS — M25462 Effusion, left knee: Secondary | ICD-10-CM | POA: Diagnosis not present

## 2024-05-27 DIAGNOSIS — F17220 Nicotine dependence, chewing tobacco, uncomplicated: Secondary | ICD-10-CM | POA: Diagnosis not present
